# Patient Record
Sex: Female | Born: 1965 | Race: Black or African American | Hispanic: No | Marital: Single | Smoking: Never smoker
Health system: Southern US, Community
[De-identification: ages and names within clinical notes are randomized; demographics above are authoritative.]

## PROBLEM LIST (undated history)

## (undated) DIAGNOSIS — I10 Essential (primary) hypertension: Secondary | ICD-10-CM

## (undated) DIAGNOSIS — M329 Systemic lupus erythematosus, unspecified: Secondary | ICD-10-CM

## (undated) HISTORY — DX: Systemic lupus erythematosus, unspecified: M32.9

## (undated) HISTORY — DX: Essential (primary) hypertension: I10

## (undated) HISTORY — PX: ABDOMINAL HYSTERECTOMY: SHX81

---

## 2005-03-19 ENCOUNTER — Emergency Department: Payer: Self-pay | Admitting: Emergency Medicine

## 2005-03-26 ENCOUNTER — Ambulatory Visit: Payer: Self-pay | Admitting: Emergency Medicine

## 2006-03-04 ENCOUNTER — Emergency Department: Payer: Self-pay | Admitting: Emergency Medicine

## 2007-03-07 ENCOUNTER — Emergency Department: Payer: Self-pay | Admitting: Emergency Medicine

## 2007-03-09 ENCOUNTER — Inpatient Hospital Stay: Payer: Self-pay | Admitting: Internal Medicine

## 2007-04-13 ENCOUNTER — Emergency Department: Payer: Self-pay | Admitting: Emergency Medicine

## 2007-07-02 ENCOUNTER — Emergency Department: Payer: Self-pay | Admitting: Internal Medicine

## 2007-07-03 ENCOUNTER — Emergency Department: Payer: Self-pay | Admitting: Emergency Medicine

## 2007-12-18 ENCOUNTER — Other Ambulatory Visit: Payer: Self-pay

## 2007-12-18 ENCOUNTER — Emergency Department: Payer: Self-pay | Admitting: Emergency Medicine

## 2008-04-19 ENCOUNTER — Ambulatory Visit: Payer: Self-pay | Admitting: Family Medicine

## 2008-07-11 ENCOUNTER — Emergency Department: Payer: Self-pay | Admitting: Emergency Medicine

## 2008-10-01 ENCOUNTER — Ambulatory Visit: Payer: Self-pay | Admitting: Diagnostic Radiology

## 2008-10-07 ENCOUNTER — Ambulatory Visit: Payer: Self-pay | Admitting: Obstetrics and Gynecology

## 2009-04-12 ENCOUNTER — Emergency Department: Payer: Self-pay | Admitting: Emergency Medicine

## 2009-07-31 ENCOUNTER — Ambulatory Visit: Payer: Self-pay | Admitting: General Practice

## 2012-06-14 ENCOUNTER — Ambulatory Visit (INDEPENDENT_AMBULATORY_CARE_PROVIDER_SITE_OTHER): Payer: BC Managed Care – PPO | Admitting: Family Medicine

## 2012-06-14 VITALS — BP 182/112 | HR 96 | Temp 98.5°F | Resp 16 | Ht 65.0 in | Wt 198.0 lb

## 2012-06-14 DIAGNOSIS — J019 Acute sinusitis, unspecified: Secondary | ICD-10-CM

## 2012-06-14 DIAGNOSIS — D72819 Decreased white blood cell count, unspecified: Secondary | ICD-10-CM

## 2012-06-14 DIAGNOSIS — Z79899 Other long term (current) drug therapy: Secondary | ICD-10-CM

## 2012-06-14 DIAGNOSIS — IMO0001 Reserved for inherently not codable concepts without codable children: Secondary | ICD-10-CM

## 2012-06-14 DIAGNOSIS — M329 Systemic lupus erythematosus, unspecified: Secondary | ICD-10-CM

## 2012-06-14 DIAGNOSIS — D649 Anemia, unspecified: Secondary | ICD-10-CM

## 2012-06-14 DIAGNOSIS — R030 Elevated blood-pressure reading, without diagnosis of hypertension: Secondary | ICD-10-CM

## 2012-06-14 DIAGNOSIS — N951 Menopausal and female climacteric states: Secondary | ICD-10-CM

## 2012-06-14 LAB — POCT CBC
Hemoglobin: 14.2 g/dL (ref 12.2–16.2)
Lymph, poc: 1.3 (ref 0.6–3.4)
MCH, POC: 31.4 pg — AB (ref 27–31.2)
MCHC: 31.6 g/dL — AB (ref 31.8–35.4)
MCV: 99.3 fL — AB (ref 80–97)
MID (cbc): 0.3 (ref 0–0.9)
Platelet Count, POC: 277 10*3/uL (ref 142–424)
RBC: 4.52 M/uL (ref 4.04–5.48)
WBC: 4.1 10*3/uL — AB (ref 4.6–10.2)

## 2012-06-14 MED ORDER — AMOXICILLIN-POT CLAVULANATE 875-125 MG PO TABS: 1.0000 | ORAL_TABLET | Freq: Two times a day (BID) | ORAL | Status: AC

## 2012-06-14 MED ORDER — PREDNISONE 10 MG PO TABS: ORAL_TABLET | ORAL | Status: AC

## 2012-06-14 NOTE — Progress Notes (Signed)
Subjective:    Patient ID: Jill Morgan, female    DOB: 05-24-1966, 46 y.o.   MRN: [id number]  HPI  Pt thinks she may be starting to be menopausal. Having hot flashes, insomnia, night sweats for 2-3 wks. Had partial hysterectomy in 2010 for anemia - no cancer.  Is having mood swings, no breast tenderness.  Monday (3d prev) she dev sinus and chest congestion, sinus pain, teeth pain, jaw pain, dry cough, sneezing, voice change, lymph nodes swollen, no sore throat,  Has a h/o HTN - think she was put triamterene and was allergic to this.  Then she was put on something else to get her BP down but then taken off once it was down.  States that her blood pressure often gets to 185/115 when she is sick but that it is veyr labile and that when she if feeling well she can be hypotensive. Her lupus is very well controlled on plaquenil alone. She sees her rheumatologist once a yr but does not have any other or reg doctor.   Review of Systems  Constitutional: Positive for diaphoresis and fatigue. Negative for fever, chills, appetite change and unexpected weight change.  HENT: Positive for congestion, rhinorrhea, sneezing, trouble swallowing, voice change, postnasal drip and sinus pressure. Negative for sore throat, neck pain, neck stiffness, dental problem and ear discharge.   Respiratory: Negative for shortness of breath.   Gastrointestinal: Negative for nausea, diarrhea and constipation.  Genitourinary: Negative for menstrual problem.  Skin: Negative for color change, pallor and rash.  Neurological: Negative for weakness.  Hematological: Positive for adenopathy.  Psychiatric/Behavioral: Positive for behavioral problems and disturbed wake/sleep cycle. The patient is not nervous/anxious.        Objective:   Physical Exam  Constitutional: She is oriented to person, place, and time. She appears well-developed and well-nourished. No distress.  HENT:  Head: Normocephalic and atraumatic.  Right Ear:  External ear and ear canal normal. Tympanic membrane is retracted. A middle ear effusion is present.  Left Ear: External ear and ear canal normal. Tympanic membrane is retracted. A middle ear effusion is present.  Nose: Mucosal edema and rhinorrhea present. Right sinus exhibits maxillary sinus tenderness. Left sinus exhibits maxillary sinus tenderness.  Mouth/Throat: Oropharynx is clear and moist. No oropharyngeal exudate.  Eyes: Conjunctivae normal and EOM are normal. Pupils are equal, round, and reactive to light. Right eye exhibits no discharge. Left eye exhibits no discharge. No scleral icterus.  Neck: Normal range of motion. Neck supple. No thyromegaly present.  Cardiovascular: Normal rate, regular rhythm, normal heart sounds and intact distal pulses.   Pulmonary/Chest: Effort normal and breath sounds normal. No respiratory distress.  Musculoskeletal: She exhibits no edema.  Lymphadenopathy:       Head (right side): Submandibular adenopathy present.       Head (left side): Submandibular adenopathy present.    She has cervical adenopathy.       Right cervical: Superficial cervical adenopathy present.       Left cervical: Superficial cervical adenopathy present.       Right: No supraclavicular adenopathy present.       Left: No supraclavicular adenopathy present.  Neurological: She is alert and oriented to person, place, and time. She has normal reflexes. She exhibits normal muscle tone.  Skin: Skin is warm and dry. No rash noted. She is not diaphoretic. No erythema. No pallor.  Psychiatric: She has a normal mood and affect. Her behavior is normal.  Results for orders placed in visit on 06/14/12  POCT CBC      Component Value Range   WBC 4.1 (*) 4.6 - 10.2 K/uL   Lymph, poc 1.3  0.6 - 3.4   POC LYMPH PERCENT 32.6  10 - 50 %L   MID (cbc) 0.3  0 - 0.9   POC MID % 6.2  0 - 12 %M   POC Granulocyte 2.5  2 - 6.9   Granulocyte percent 61.2  37 - 80 %G   RBC 4.52  4.04 - 5.48  M/uL   Hemoglobin 14.2  12.2 - 16.2 g/dL   HCT, POC 66.4  40.3 - 47.9 %   MCV 99.3 (*) 80 - 97 fL   MCH, POC 31.4 (*) 27 - 31.2 pg   MCHC 31.6 (*) 31.8 - 35.4 g/dL   RDW, POC 47.4     Platelet Count, POC 277  142 - 424 K/uL   MPV 8.3  0 - 99.8 fL    Assessment & Plan:  1. Sinusitis - Trx w/ prednisone taper and augmentin 2. Elevated blood pressure - no otc cough/cold preps. Recheck in 10d.  Will start bp meds at that time if not at goal. Pt agrees w/ plan 3. Lupus - stable, cont plaquinel 4. Hot flashes, night sweats - check cbc, tsh.  Check fsh and lh to investigate menopausal status and discuss further at f/u in 10d. Add on hiv since wbc mildly low.

## 2012-06-15 ENCOUNTER — Telehealth: Payer: Self-pay | Admitting: Family Medicine

## 2012-06-15 ENCOUNTER — Encounter: Payer: Self-pay | Admitting: Family Medicine

## 2012-06-15 DIAGNOSIS — M329 Systemic lupus erythematosus, unspecified: Secondary | ICD-10-CM | POA: Insufficient documentation

## 2012-06-15 DIAGNOSIS — Z79899 Other long term (current) drug therapy: Secondary | ICD-10-CM | POA: Insufficient documentation

## 2012-06-15 DIAGNOSIS — IMO0001 Reserved for inherently not codable concepts without codable children: Secondary | ICD-10-CM | POA: Insufficient documentation

## 2012-06-15 NOTE — Telephone Encounter (Signed)
Called to add on to patient specimen

## 2012-06-15 NOTE — Telephone Encounter (Signed)
Message copied by Gerrianne Scale on Thu Jun 15, 2012 11:27 AM ------      Message from: Clelia Croft, EVA      Created: Thu Jun 15, 2012 10:52 AM       i also ordered a tsh and hiv. Can these please be added to the blood in lab? If not, dont worry about it as we can get them at her f/u visit.      Thanks,      shaw

## 2012-06-16 LAB — TSH: TSH: 0.844 u[IU]/mL (ref 0.350–4.500)

## 2012-06-16 LAB — HIV ANTIBODY (ROUTINE TESTING W REFLEX): HIV: NONREACTIVE

## 2012-07-03 ENCOUNTER — Ambulatory Visit (INDEPENDENT_AMBULATORY_CARE_PROVIDER_SITE_OTHER): Payer: BC Managed Care – PPO | Admitting: Family Medicine

## 2012-07-03 VITALS — BP 143/85 | HR 86 | Temp 98.4°F | Resp 18 | Ht 64.0 in | Wt 196.6 lb

## 2012-07-03 DIAGNOSIS — IMO0001 Reserved for inherently not codable concepts without codable children: Secondary | ICD-10-CM

## 2012-07-03 DIAGNOSIS — R030 Elevated blood-pressure reading, without diagnosis of hypertension: Secondary | ICD-10-CM

## 2012-07-03 DIAGNOSIS — N951 Menopausal and female climacteric states: Secondary | ICD-10-CM

## 2012-07-03 DIAGNOSIS — R232 Flushing: Secondary | ICD-10-CM

## 2012-07-03 NOTE — Progress Notes (Signed)
  Subjective:    Patient ID: Jill Morgan, female    DOB: 10/05/1965, 46 y.o.   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  She doesn't remember numbers. Was on hctz in past but able to come off. Hot flashes intermittently.  Last wk she would suddenly break out in them and are affecting her sleep - have been dealing with for about 6-8 wks no.  Is having more irritability and trouble sleeping. No breast tenderness.  No past medical history on file.   Review of Systems  Constitutional: Positive for fatigue. Negative for fever and chills.  HENT: Positive for congestion and sinus pressure.   Cardiovascular: Negative for chest pain.  Genitourinary: Negative for vaginal bleeding.  Musculoskeletal: Negative for gait problem.  Skin: Negative for color change.  Neurological: Positive for headaches.  Hematological: Negative for adenopathy.  Psychiatric/Behavioral: Positive for disturbed wake/sleep cycle and dysphoric mood.      BP 143/85  Pulse 86  Temp 98.4 F (36.9 C) (Oral)  Resp 18  Ht 5\' 4"  (1.626 m)  Wt 196 lb 9.6 oz (89.177 kg)  BMI 33.75 kg/m2  SpO2 100% Objective:   Physical Exam  Constitutional: She is oriented to person, place, and time. She appears well-developed and well-nourished. No distress.  HENT:  Head: Normocephalic and atraumatic.  Right Ear: External ear normal.  Left Ear: External ear normal.  Eyes: Conjunctivae normal are normal. No scleral icterus.  Neck: Normal range of motion. Neck supple. No thyromegaly present.  Cardiovascular: Normal rate, regular rhythm, normal heart sounds and intact distal pulses.   Pulmonary/Chest: Effort normal and breath sounds normal. No respiratory distress.  Musculoskeletal: She exhibits no edema.  Lymphadenopathy:    She has no cervical adenopathy.  Neurological: She is alert and oriented to person, place, and time.  Skin: Skin is warm and dry. She is not diaphoretic. No erythema.  Psychiatric: She has a  normal mood and affect. Her behavior is normal.          Assessment & Plan:  1. Elevated BP - Cont to have checked at work 1-2x/wk and record. Call in sev wks w/ BP readings.  If  >140/90, start hctz. 2. Hot flashes - hormone testing was unrevealing - pt could be perimenopausal which is certainly most likely but difficult to tell due to hysterectomy (stil has ovaries.)  Rec trying some supplements - soy, black cohosh, evening primrose - to see if there is any help. If not, RTC for further eval. Could consider repeat hormone testing in 4-6 mos.

## 2012-07-03 NOTE — Patient Instructions (Addendum)
Diaphoresis Sweating is controlled by our nervous system. Sweat glands are found in the skin throughout the body. They exist in higher numbers in the skin of the hands, feet, armpits, and the genital region. Sweating occurs normally when the temperature of your body goes up. Diaphoresis means profuse sweating or perspiration due to an underlying medical condition or an external factor (such as medicines). Hyperhidrosis means excessive sweating that is not usually due to an underlying medical condition, on areas such as the palms, soles, or armpits. Other areas of the body may also be affected. Hyperhidrosis usually begins in childhood or early adolescence. It increases in severity through puberty and into adulthood. Sweaty palms are the most common problem and the most bothersome to people who have hyperhidrosis. CAUSES  Sweating is normally seen with exercise or being in hot surroundings. Not sweating in these conditions would be harmful. Stressful situations can also cause sweating. In some people, stimulation of the sweat glands during stress is overactive. Talking to strangers or shaking someone's hand can produce profuse sweating. Causes of sweating include:  Emotional upset.  Low blood pressure.  Low blood sugar.  Heart problems.  Low blood cell counts.  Certain pain relieving medicines.  Exercise.  Alcohol.  Infection.  Caffeine.  Spicy foods.  Hot flashes.  Overactive thyroid.  Illegal drug use, such as cocaine and amphetamine.  Use of medicines that stimulate parts of the nervous system.  A tumor (pheochromocytoma).  Withdrawal from some medicines or alcohol. DIAGNOSIS  Your caregiver needs to be consulted to make sure excessive sweating is not caused by another condition. Further testing may need to be done. TREATMENT   When hyperhidrosis is caused by another condition, that condition should be treated.  If menopause is the cause, you may wish to talk to your  caregiver about estrogen replacement.  If the hyperhidrosis is a natural happening of the way your body works, certain antiperspirants may help.  If medicines do not work, injections of botulinum toxin type A are sometimes used for underarm sweating.  Your caregiver can usually help you with this problem. Document Released: 03/22/2005 Document Revised: 11/22/2011 Document Reviewed: 10/07/2008 [facility] Patient Information 2013 [place], [place]. Menopause and Herbal Products Menopause is the normal time of life when menstrual periods stop completely. Menopause is complete when you have missed 12 consecutive menstrual periods. It usually occurs between the ages of 45 to 22, with an average age of 24. Very rarely does a woman develop menopause before 44 years old. At menopause, your ovaries stop producing the female hormones, estrogen and progesterone. This can cause undesirable symptoms and also affect your health. Sometimes the symptoms can occur 4 to 5 years before the menopause begins. There is no relationship between menopause and: Oral contraceptives. Number of children you had. Race. The age your menstrual periods started (menarche). Heavy smokers and very thin women may develop menopause earlier in life. Estrogen and progesterone hormone treatment is the usual method of treating menopausal symptoms. However, there are women who should not take hormone treatment. This is true of:  Women that have breast or uterine cancer. Women who prefer not to take hormones because of certain side effects (abnormal uterine bleeding). Women who are afraid that hormones may cause breast cancer. Women who have a history of liver disease, heart disease, stroke, or blood clots. For these women, there are other medications that may help treat their menopausal symptoms. These medications are found in plants and botanical products. They can be found  in the form of herbs, teas, oils, tinctures, and pills.   CAUSES: The ovaries stop producing the female hormones estrogen and progesterone. Other causes include: Surgery to remove both ovaries. The ovaries stop functioning for no know reason. Tumors of the pituitary gland in the brain. Medical disease that affects the ovaries and hormone production. Radiation treatment to the abdomen or pelvis. Chemotherapy that affects the ovaries. PHYTOESTROGENS: Phytoestrogen's occur naturally in plants and plant products. They act like estrogen in the body. Herbal medications are made from these plants and botanical steroids. There are 3 types of phytoestrogens: Isoflavones (genistein and daidzein) are found in soy, garbanzo beans, miso and tofu foods. Ligins are found in the shell of seeds. They are used to make oils like flaxseed oil. The bacteria in your intestine act on these foods to produce the estrogen-like hormones. Coumestans are estrogen-like. Some of the foods they are found in include sunflower seeds and bean sprouts. CONDITIONS AND THERE POSSIBLE HERBAL TREATMENT: Hot flashes and night sweats. Soy, black cohosh and evening primrose. Irritability, insomnia, depression and memory problems. Chasteberry, ginseng, and soy. St. John's wort may be helpful for depression. However, there is a concern of it causing cataracts of the eye and may have bad effects on other medications. St. John's wort should not be taken for long time and without your caregiver's advice. Loss of libido and vaginal and skin dryness. Wild yam and soy. Prevention of coronary heart disease and osteoporosis. Soy and Isoflavones. Several studies have shown that some women benefit from herbal medications, but most of the studies have not consistently shown that these supplements are much better than placebo. Other forms of treatment to help women with menopausal symptoms include a balanced diet, rest, exercise, vitamin and calcium (with vitamin D) supplements, acupuncture, and group  therapy when necessary. THOSE WHO SHOULD NOT TAKE HERBAL MEDICATIONS INCLUDE: Women who are planning on getting pregnant unless told by your caregiver. Women who are breastfeeding unless told by your caregiver. Women who are taking other prescription medications unless told by your caregiver. Infants, children, and elderly women unless told by your caregiver. Different herbal medications have different and unmeasured amounts of the herbal ingredients. There are no regulations, quality control, and standardization of the ingredients in herbal medications. Therefore, the amount of the ingredient in the medication may vary from one herb, pill, tea, oil or tincture to another. Many herbal medications can cause serious problems and can even have poisonous effects if taken too much or too long. If problems develop, the medication should be stopped and recorded by your caregiver. HOME CARE INSTRUCTIONS Do not take or give children herbal medications without your caregiver's advice. Let your caregiver know all the medications you are taking. This includes prescription, over-the-counter, eye drops, and creams. Do not take herbal medications longer or more than recommended. Tell your caregiver about any side effects from the medication. SEEK MEDICAL CARE IF: You develop a fever of 102 F (38.9 C), or as directed by your caregiver. You feel sick to your stomach (nauseous), vomit, or have diarrhea. You develop a rash. You develop abdominal pain. You develop severe headaches. You start to have vision problems. You feel dizzy or faint. You start to feel numbness in any part of your body. You start shaking (have convulsions). Document Released: 02/16/2008 Document Revised: 11/22/2011 Document Reviewed: 09/15/2010 [facility] Patient Information 2013 [employer], [place]. Perimenopause Perimenopause is the time when your body begins to move into the menopause (no menstrual period for 12 straight  months). It is a  natural process. Perimenopause can begin 2 to 8 years before the menopause and usually lasts for one year after the menopause. During this time, your ovaries may or may not produce an egg. The ovaries vary in their production of estrogen and progesterone hormones each month. This can cause irregular menstrual periods, difficulty in getting pregnant, vaginal bleeding between periods and uncomfortable symptoms. CAUSES  Irregular production of the ovarian hormones, estrogen and progesterone, and not ovulating every month.  Other causes include:  Tumor of the pituitary gland in the brain.  Medical disease that affects the ovaries.  Radiation treatment.  Chemotherapy.  Unknown causes.  Heavy smoking and excessive alcohol intake can bring on perimenopause sooner. SYMPTOMS   Hot flashes.  Night sweats.  Irregular menstrual periods.  Decrease sex drive.  Vaginal dryness.  Headaches.  Mood swings.  Depression.  Memory problems.  Irritability.  Tiredness.  Weight gain.  Trouble getting pregnant.  The beginning of losing bone cells (osteoporosis).  The beginning of hardening of the arteries (atherosclerosis). DIAGNOSIS  Your caregiver will make a diagnosis by analyzing your age, menstrual history and your symptoms. They will do a physical exam noting any changes in your body, especially your female organs. Female hormone tests may or may not be helpful depending on the amount and when you produce the female hormones. However, other hormone tests may be helpful (ex. thyroid hormone) to rule out other problems. TREATMENT  The decision to treat during the perimenopause should be made by you and your caregiver depending on how the symptoms are affecting you and your life style. There are various treatments available such as:  Treating individual symptoms with a specific medication for that symptom (ex. tranquilizer for depression).  Herbal medications that can help specific  symptoms.  Counseling.  Group therapy.  No treatment. HOME CARE INSTRUCTIONS   Before seeing your caregiver, make a list of your menstrual periods (when the occur, how heavy they are, how long between periods and how long they last), your symptoms and when they started.  Take the medication as recommended by your caregiver.  Sleep and rest.  Exercise.  Eat a diet that contains calcium (good for your bones) and soy (acts like estrogen hormone).  Do not smoke.  Avoid alcoholic beverages.  Taking vitamin E may help in certain cases.  Take calcium and vitamin D supplements to help prevent bone loss.  Group therapy is sometimes helpful.  Acupuncture may help in some cases. SEEK MEDICAL CARE IF:   You have any of the above and want to know if it is perimenopause.  You want advice and treatment for any of your symptoms mentioned above.  You need a referral to a specialist (gynecologist, psychiatrist or psychologist). SEEK IMMEDIATE MEDICAL CARE IF:   You have vaginal bleeding.  Your period lasts longer than 8 days.  You periods are recurring sooner than 21 days.  You have bleeding after intercourse.  You have severe depression.  You have pain when you urinate.  You have severe headaches.  You develop vision problems. Document Released: 10/07/2004 Document Revised: 11/22/2011 Document Reviewed: 06/27/2008 [facility] Patient Information 2013 [place], [place].

## 2012-08-02 ENCOUNTER — Telehealth: Payer: Self-pay

## 2012-08-02 NOTE — Telephone Encounter (Signed)
Pt is a lupus patient and has been around someone that has been diagnosed with the flu and is now experiencing symptoms and would like to know if we can call something in for her  Best number [phone]

## 2012-08-03 NOTE — Telephone Encounter (Signed)
Tried to call pt on both #s. No ans/no VM

## 2012-08-03 NOTE — Telephone Encounter (Signed)
Pt returned call.  Best number [phone].

## 2012-08-03 NOTE — Telephone Encounter (Signed)
Tried to call again. No ans/no VM. 

## 2012-08-04 NOTE — Telephone Encounter (Signed)
Per PA she needs to come in to be seen

## 2012-08-05 NOTE — Telephone Encounter (Signed)
Left message for her, to advise to come back in.

## 2012-08-19 ENCOUNTER — Ambulatory Visit (INDEPENDENT_AMBULATORY_CARE_PROVIDER_SITE_OTHER): Payer: BC Managed Care – PPO | Admitting: Family Medicine

## 2012-08-19 VITALS — BP 152/103 | HR 80 | Temp 98.3°F | Resp 16 | Ht 64.5 in | Wt 198.0 lb

## 2012-08-19 DIAGNOSIS — R232 Flushing: Secondary | ICD-10-CM

## 2012-08-19 DIAGNOSIS — N951 Menopausal and female climacteric states: Secondary | ICD-10-CM

## 2012-08-19 DIAGNOSIS — I10 Essential (primary) hypertension: Secondary | ICD-10-CM

## 2012-08-19 DIAGNOSIS — M791 Myalgia, unspecified site: Secondary | ICD-10-CM

## 2012-08-19 DIAGNOSIS — J029 Acute pharyngitis, unspecified: Secondary | ICD-10-CM

## 2012-08-19 DIAGNOSIS — M329 Systemic lupus erythematosus, unspecified: Secondary | ICD-10-CM

## 2012-08-19 DIAGNOSIS — IMO0001 Reserved for inherently not codable concepts without codable children: Secondary | ICD-10-CM

## 2012-08-19 LAB — POCT CBC
Granulocyte percent: 61.5 %G (ref 37–80)
Hemoglobin: 14.3 g/dL (ref 12.2–16.2)
MCV: 99.6 fL — AB (ref 80–97)
MID (cbc): 0.3 (ref 0–0.9)
Platelet Count, POC: 259 10*3/uL (ref 142–424)
RBC: 4.64 M/uL (ref 4.04–5.48)

## 2012-08-19 LAB — CK: Total CK: 94 U/L (ref 7–177)

## 2012-08-19 LAB — POCT RAPID STREP A (OFFICE): Rapid Strep A Screen: NEGATIVE

## 2012-08-19 MED ORDER — IBUPROFEN 800 MG PO TABS: 800.0000 mg | ORAL_TABLET | Freq: Three times a day (TID) | ORAL | Status: AC | PRN

## 2012-08-19 NOTE — Progress Notes (Signed)
Subjective: Patient is here today with a history of not feeling well for the past couple of days. She's been having hot flashes and flushed sensation. She has body aches and feels like she has the flu. She has not had any actual fever however. She has had these kind of hot flash sensations off and on at times. A Cook including a couple months ago when she was in the office. She has the diagnoses of having lupus. She had abnormal symptoms from about 2000 02/13/2008 weight was 5 diagnosed at [facility]. She has a rheumatologist there, but has not seen them for about a year. She has been coming sporadically here to [employer]. She is on her same regular medications. Last time Dr. Clelia Croft felt like the patient had a borderline FSH indicating may be early menopause. She's had a discharge weight. She was told to take OTC black cohosh containing medication. She generally works 2 jobs, as a Geophysicist/field seismologist and as a Lawyer. She has remained very functional despite her chronic disease issues. Her throat is been little bit sore. She had to dig some white stuff out of the tonsil. She aches in her hands.  Objective: Pleasant lady in no major distress at this time. TMs normal. Throat not erythematous. Strep screen was taken. Neck supple without significant nodes. Chest clear to auscultation. Heart regular without murmurs. Abdomen soft without mass or tenderness. Extremities grossly normal. Skin is cool and dry, though she says she just started having the lows hot flash sensations now.  Assessment: Hot flashes History of lupus Pharyngitis Myalgias Early menopause  Plan: Check CPK, sedimentation rate, strep screen, and repeat her [facility] May need to put her on some estrogen. We'll probably recommend she tries to see her rheumatologist back sometime in the near future  Results for orders placed in visit on 08/19/12  POCT CBC      Component Value Range   WBC 3.9 (*) 4.6 - 10.2 K/uL   Lymph, poc 1.2  0.6 - 3.4    POC LYMPH PERCENT 31.6  10 - 50 %L   MID (cbc) 0.3  0 - 0.9   POC MID % 6.9  0 - 12 %M   POC Granulocyte 2.4  2 - 6.9   Granulocyte percent 61.5  37 - 80 %G   RBC 4.64  4.04 - 5.48 M/uL   Hemoglobin 14.3  12.2 - 16.2 g/dL   HCT, POC 40.9  81.1 - 47.9 %   MCV 99.6 (*) 80 - 97 fL   MCH, POC 30.8  27 - 31.2 pg   MCHC 31.0 (*) 31.8 - 35.4 g/dL   RDW, POC 91.4     Platelet Count, POC 259  142 - 424 K/uL   MPV 8.3  0 - 99.8 fL  POCT RAPID STREP A (OFFICE)      Component Value Range   Rapid Strep A Screen Negative  Negative   Hx of a myositis in past when her CPK went very high [id number].  Decided against steroids today.  Take NSAIDS.  If FSH high will treat with estrogens.  Needs to see a rheumatologist.  Patient's blood pressure was high and we called her back to tell her we're going to put her on medication. Started lisinopril HCT 10/12.5 one daily. She is to monitor it and let us know.

## 2012-08-19 NOTE — Addendum Note (Signed)
Addended byCaffie Damme on: 08/19/2012 05:22 PM   Modules accepted: Orders

## 2012-08-19 NOTE — Patient Instructions (Signed)
Take antiinflammatory meds (ibuprofen, aleve) for a few days  When better increase exercise and work on weight loss as discussed.  Await test regarding menopause.

## 2012-08-20 MED ORDER — ESTRADIOL 0.5 MG PO TABS: 0.5000 mg | ORAL_TABLET | Freq: Every day | ORAL | Status: AC

## 2012-08-20 NOTE — Addendum Note (Signed)
Addended by: Johnnette Litter on: 08/20/2012 03:07 PM   Modules accepted: Orders

## 2012-11-27 ENCOUNTER — Other Ambulatory Visit: Payer: Self-pay | Admitting: Family Medicine

## 2012-11-27 NOTE — Telephone Encounter (Signed)
Needs office visit.

## 2012-12-06 ENCOUNTER — Other Ambulatory Visit: Payer: Self-pay | Admitting: Internal Medicine

## 2012-12-06 DIAGNOSIS — Z1231 Encounter for screening mammogram for malignant neoplasm of breast: Secondary | ICD-10-CM

## 2012-12-15 ENCOUNTER — Ambulatory Visit
Admission: RE | Admit: 2012-12-15 | Discharge: 2012-12-15 | Disposition: A | Discharge status: Home / Self Care | Payer: BC Managed Care – PPO | Source: Ambulatory Visit | Attending: Internal Medicine | Admitting: Internal Medicine

## 2012-12-15 DIAGNOSIS — Z1231 Encounter for screening mammogram for malignant neoplasm of breast: Secondary | ICD-10-CM

## 2013-02-13 ENCOUNTER — Emergency Department: Payer: Self-pay | Admitting: Emergency Medicine

## 2013-03-12 ENCOUNTER — Other Ambulatory Visit: Payer: Self-pay | Admitting: Family Medicine

## 2013-06-21 ENCOUNTER — Other Ambulatory Visit: Payer: Self-pay | Admitting: Physician Assistant

## 2013-07-27 ENCOUNTER — Other Ambulatory Visit: Payer: Self-pay | Admitting: Physician Assistant

## 2013-11-02 ENCOUNTER — Ambulatory Visit: Payer: Self-pay | Admitting: Internal Medicine

## 2013-11-11 ENCOUNTER — Ambulatory Visit: Payer: Self-pay | Admitting: Internal Medicine

## 2013-11-17 ENCOUNTER — Other Ambulatory Visit: Payer: Self-pay | Admitting: Physician Assistant

## 2014-02-11 ENCOUNTER — Ambulatory Visit: Payer: Self-pay | Admitting: Urology

## 2014-02-18 ENCOUNTER — Other Ambulatory Visit: Payer: Self-pay

## 2014-02-18 DIAGNOSIS — Z1231 Encounter for screening mammogram for malignant neoplasm of breast: Secondary | ICD-10-CM

## 2014-02-21 ENCOUNTER — Ambulatory Visit
Admission: RE | Admit: 2014-02-21 | Discharge: 2014-02-21 | Disposition: A | Discharge status: Home / Self Care | Payer: BC Managed Care – PPO | Source: Ambulatory Visit

## 2014-02-21 ENCOUNTER — Encounter (INDEPENDENT_AMBULATORY_CARE_PROVIDER_SITE_OTHER): Payer: Self-pay

## 2014-02-21 DIAGNOSIS — Z1231 Encounter for screening mammogram for malignant neoplasm of breast: Secondary | ICD-10-CM

## 2016-06-08 ENCOUNTER — Other Ambulatory Visit: Payer: Self-pay | Admitting: Internal Medicine

## 2016-06-08 DIAGNOSIS — Z1231 Encounter for screening mammogram for malignant neoplasm of breast: Secondary | ICD-10-CM

## 2016-06-29 ENCOUNTER — Ambulatory Visit
Admission: RE | Admit: 2016-06-29 | Discharge: 2016-06-29 | Disposition: A | Discharge status: Home / Self Care | Payer: BC Managed Care – PPO | Source: Ambulatory Visit | Attending: Internal Medicine | Admitting: Internal Medicine

## 2016-06-29 ENCOUNTER — Ambulatory Visit: Payer: BC Managed Care – PPO

## 2016-06-29 DIAGNOSIS — Z1231 Encounter for screening mammogram for malignant neoplasm of breast: Secondary | ICD-10-CM

## 2017-10-03 ENCOUNTER — Other Ambulatory Visit: Payer: Self-pay | Admitting: Internal Medicine

## 2017-10-03 ENCOUNTER — Ambulatory Visit
Admission: RE | Admit: 2017-10-03 | Discharge: 2017-10-03 | Disposition: A | Discharge status: Home / Self Care | Payer: BC Managed Care – PPO | Source: Ambulatory Visit | Attending: Internal Medicine | Admitting: Internal Medicine

## 2017-10-03 DIAGNOSIS — M7989 Other specified soft tissue disorders: Secondary | ICD-10-CM

## 2017-10-06 ENCOUNTER — Other Ambulatory Visit: Payer: Self-pay | Admitting: Internal Medicine

## 2017-10-06 DIAGNOSIS — Z1231 Encounter for screening mammogram for malignant neoplasm of breast: Secondary | ICD-10-CM

## 2017-10-27 ENCOUNTER — Ambulatory Visit: Payer: BC Managed Care – PPO

## 2017-11-17 ENCOUNTER — Ambulatory Visit
Admission: RE | Admit: 2017-11-17 | Discharge: 2017-11-17 | Disposition: A | Discharge status: Home / Self Care | Payer: BC Managed Care – PPO | Source: Ambulatory Visit | Attending: Internal Medicine | Admitting: Internal Medicine

## 2017-11-17 DIAGNOSIS — Z1231 Encounter for screening mammogram for malignant neoplasm of breast: Secondary | ICD-10-CM

## 2018-10-26 ENCOUNTER — Other Ambulatory Visit: Payer: Self-pay | Admitting: Internal Medicine

## 2018-10-26 DIAGNOSIS — Z1231 Encounter for screening mammogram for malignant neoplasm of breast: Secondary | ICD-10-CM

## 2018-11-23 ENCOUNTER — Other Ambulatory Visit: Payer: Self-pay

## 2018-11-23 ENCOUNTER — Other Ambulatory Visit: Payer: Self-pay | Admitting: Internal Medicine

## 2018-11-23 ENCOUNTER — Ambulatory Visit
Admission: RE | Admit: 2018-11-23 | Discharge: 2018-11-23 | Disposition: A | Discharge status: Home / Self Care | Payer: BC Managed Care – PPO | Source: Ambulatory Visit | Attending: Internal Medicine | Admitting: Internal Medicine

## 2018-11-23 DIAGNOSIS — Z1231 Encounter for screening mammogram for malignant neoplasm of breast: Secondary | ICD-10-CM

## 2019-05-16 ENCOUNTER — Other Ambulatory Visit: Payer: Self-pay

## 2019-05-16 ENCOUNTER — Emergency Department
Admission: EM | Admit: 2019-05-16 | Discharge: 2019-05-16 | Disposition: A | Discharge status: Home / Self Care | Payer: BC Managed Care – PPO | Attending: Emergency Medicine | Admitting: Emergency Medicine

## 2019-05-16 ENCOUNTER — Emergency Department: Payer: BC Managed Care – PPO

## 2019-05-16 DIAGNOSIS — Z7902 Long term (current) use of antithrombotics/antiplatelets: Secondary | ICD-10-CM | POA: Insufficient documentation

## 2019-05-16 DIAGNOSIS — Z86718 Personal history of other venous thrombosis and embolism: Secondary | ICD-10-CM | POA: Diagnosis not present

## 2019-05-16 DIAGNOSIS — I10 Essential (primary) hypertension: Secondary | ICD-10-CM | POA: Diagnosis not present

## 2019-05-16 DIAGNOSIS — Z79899 Other long term (current) drug therapy: Secondary | ICD-10-CM | POA: Diagnosis not present

## 2019-05-16 DIAGNOSIS — M3210 Systemic lupus erythematosus, organ or system involvement unspecified: Secondary | ICD-10-CM | POA: Insufficient documentation

## 2019-05-16 DIAGNOSIS — M25562 Pain in left knee: Secondary | ICD-10-CM | POA: Insufficient documentation

## 2019-05-16 NOTE — ED Triage Notes (Signed)
Pt to the er for pain to the left knee. Pt has a torn meniscus that was diagnosed on Monday. Pt now has pain behind the left knee and distal medial side. Pain shoots down the front of the lower leg. Pt has a hx of lupus and DVT. Some swelling and bruising to the medial anterior

## 2019-05-16 NOTE — ED Provider Notes (Signed)
[facility] Emergency Department Provider Note  ____________________________________________  Time seen: Approximately 10:49 PM  I have reviewed the triage vital signs and the nursing notes.   HISTORY  Chief Complaint Knee Pain    HPI Jill Morgan is a 53 y.o. female with a history of DVT, presents to the emergency department with left lower extremity swelling and posterior knee pain that started today.  Patient reports a history of prior DVT and she is not currently anticoagulated.  She has worn a knee immobilizer on the left knee for the past 3 days.  She denies recent falls or traumas.  She has been tentatively diagnosed with a meniscal tear.  She denies recent surgery or recent travel.  She denies daily smoking or use of oral contraceptives.  Patient cannot remember when she was last diagnosed with a DVT.        Past Medical History:  Diagnosis Date  . Hypertension   . Lupus (HCC)   . Lupus [facility](HCC)     Patient Active Problem List   Diagnosis Date Noted  . HTN (hypertension) 08/19/2012  . Elevated blood pressure 06/15/2012  . Lupus (systemic lupus erythematosus) (HCC) 06/15/2012  . Encounter for long-term (current) use of other medications 06/15/2012    Past Surgical History:  Procedure Laterality Date  . ABDOMINAL HYSTERECTOMY      Prior to Admission medications   Medication Sig Start Date End Date Taking? Authorizing Provider  amoxicillin-clavulanate (AUGMENTIN) 875-125 MG per tablet Take 1 tablet by mouth 2 (two) times daily. 06/14/12   Sherren MochaShaw, Eva N, MD  B Complex Vitamins (B-COMPLEX/B-12 PO) Take 1 capsule by mouth daily.    [provider]  estradiol (ESTRACE) 0.5 MG tablet Take 1 tablet (0.5 mg total) by mouth daily. 08/20/12   Peyton NajjarHopper, David H, MD  hydroxychloroquine (PLAQUENIL) 200 MG tablet Take 200 mg by mouth 2 (two) times daily.     [provider]  ibuprofen (ADVIL,MOTRIN) 200 MG tablet Take 200 mg by mouth every 6  (six) hours as needed.    [provider]  ibuprofen (ADVIL,MOTRIN) 800 MG tablet Take 1 tablet (800 mg total) by mouth every 8 (eight) hours as needed for pain. 08/19/12   Peyton NajjarHopper, David H, MD  lisinopril-hydrochlorothiazide (PRINZIDE,ZESTORETIC) 10-12.5 MG per tablet Take 1 tablet by mouth daily. PATIENT NEEDS OFFICE VISIT FOR ADDITIONAL REFILLS - 2nd NOTICE 07/27/13   Godfrey PickEgan, Eleanore E, PA-C  predniSONE (DELTASONE) 10 MG tablet Taper as directed from 6 tabs a day reduce by 1 each day 06/14/12   Sherren MochaShaw, Eva N, MD    Allergies Patient has no known allergies.  No family history on file.  Social History Social History   Tobacco Use  . Smoking status: Never Smoker  Substance Use Topics  . Alcohol use: Never    Frequency: Never  . Drug use: Never     Review of Systems  Constitutional: No fever/chills Eyes: No visual changes. No discharge ENT: No upper respiratory complaints. Cardiovascular: no chest pain. Respiratory: no cough. No SOB. Gastrointestinal: No abdominal pain.  No nausea, no vomiting.  No diarrhea.  No constipation. Genitourinary: Negative for dysuria. No hematuria Musculoskeletal: Patient has left knee pain.  Skin: Negative for rash, abrasions, lacerations, ecchymosis. Neurological: Negative for headaches, focal weakness or numbness.   ____________________________________________   PHYSICAL EXAM:  VITAL SIGNS: ED Triage Vitals [05/16/19 2124]  Enc Vitals Group     BP 139/87     Pulse Rate 91  Resp 18     Temp 98.6 F (37 C)     Temp Source Oral     SpO2 100 %     Weight 201 lb (91.2 kg)     Height 5\' 5"  (1.651 m)     Head Circumference      Peak Flow      Pain Score 8     Pain Loc      Pain Edu?      Excl. in GC?      Constitutional: Alert and oriented. Well appearing and in no acute distress. Eyes: Conjunctivae are normal. PERRL. EOMI. Head: Atraumatic. Cardiovascular: Normal rate, regular rhythm. Normal S1 and S2.  Good peripheral  circulation. Respiratory: Normal respiratory effort without tachypnea or retractions. Lungs CTAB. Good air entry to the bases with no decreased or absent breath sounds. Musculoskeletal: Patient has loss of peripatellar dimpling of left knee.  She has mild swelling at left ankle.  She has tenderness with palpation of left calf.  Palpable dorsalis pedis pulse, left. Neurologic:  Normal speech and language. No gross focal neurologic deficits are appreciated.  Skin:  Skin is warm, dry and intact. No rash noted. Psychiatric: Mood and affect are normal. Speech and behavior are normal. Patient exhibits appropriate insight and judgement.   ____________________________________________   LABS (all labs ordered are listed, but only abnormal results are displayed)  Labs Reviewed - No data to display ____________________________________________  EKG   ____________________________________________  RADIOLOGY I personally viewed and evaluated these images as part of my medical decision making, as well as reviewing the written report by the radiologist.    US Venous Img Lower Unilateral Left  Result Date: 05/16/2019 CLINICAL DATA:  Initial evaluation for possible DVT. EXAM: LEFT LOWER EXTREMITY VENOUS DOPPLER ULTRASOUND TECHNIQUE: Gray-scale sonography with graded compression, as well as color Doppler and duplex ultrasound were performed to evaluate the lower extremity deep venous systems from the level of the common femoral vein and including the common femoral, femoral, profunda femoral, popliteal and calf veins including the posterior tibial, peroneal and gastrocnemius veins when visible. The superficial great saphenous vein was also interrogated. Spectral Doppler was utilized to evaluate flow at rest and with distal augmentation maneuvers in the common femoral, femoral and popliteal veins. COMPARISON:  None. FINDINGS: Contralateral Common Femoral Vein: Respiratory phasicity is normal and symmetric with  the symptomatic side. No evidence of thrombus. Normal compressibility. Common Femoral Vein: No evidence of thrombus. Normal compressibility, respiratory phasicity and response to augmentation. Saphenofemoral Junction: No evidence of thrombus. Normal compressibility and flow on color Doppler imaging. Profunda Femoral Vein: No evidence of thrombus. Normal compressibility and flow on color Doppler imaging. Femoral Vein: No evidence of thrombus. Normal compressibility, respiratory phasicity and response to augmentation. Popliteal Vein: No evidence of thrombus. Normal compressibility, respiratory phasicity and response to augmentation. Calf Veins: No evidence of thrombus. Normal compressibility and flow on color Doppler imaging. Superficial Great Saphenous Vein: No evidence of thrombus. Normal compressibility. Venous Reflux:  None. Other Findings:  None. IMPRESSION: No evidence of deep venous thrombosis. Electronically Signed   By: Rise Mu M.D.   On: 05/16/2019 23:37    ____________________________________________    PROCEDURES  Procedure(s) performed:    Procedures    Medications - No data to display   ____________________________________________   INITIAL IMPRESSION / ASSESSMENT AND PLAN / ED COURSE  Pertinent labs & imaging results that were available during my care of the patient were reviewed by me and considered in  my medical decision making (see chart for details).  Review of the [place] CSRS was performed in accordance of the [place] prior to dispensing any controlled drugs.           Assessment and Plan: Left knee pain:  53 year old female presents to the emergency department with perceived left lower extremity swelling and worsening posterior knee pain.  Patient has been in a knee immobilizer for the past 3 days and has history of prior DVT.  Vital signs were reassuring at triage.  Patient had popliteal fullness of left knee and mild edema at left ankle.  No overlying  erythema of the left lower extremity.  Differential diagnosis includes DVT, left knee pain, venous insufficiency...  Venous ultrasound was noncontributory for thromboembolism.  Patient was advised that mild left ankle edema was likely secondary to immobilization.  Patient was advised to follow-up with orthopedics as needed regarding left knee pain.  Patient has been prescribed pain medication already.  All patient questions were answered.   ____________________________________________  FINAL CLINICAL IMPRESSION(S) / ED DIAGNOSES  Final diagnoses:  Acute pain of left knee      NEW MEDICATIONS STARTED DURING THIS VISIT:  ED Discharge Orders    None          This chart was dictated using voice recognition software/Dragon. Despite best efforts to proofread, errors can occur which can change the meaning. Any change was purely unintentional.    Lannie Fields, PA-C 05/17/19 1638    Nance Pear, MD 05/19/19 [phone]

## 2019-11-26 ENCOUNTER — Ambulatory Visit: Payer: BC Managed Care – PPO

## 2020-02-29 ENCOUNTER — Other Ambulatory Visit: Payer: Self-pay | Admitting: Obstetrics and Gynecology

## 2020-02-29 DIAGNOSIS — E2839 Other primary ovarian failure: Secondary | ICD-10-CM

## 2020-02-29 DIAGNOSIS — Z1231 Encounter for screening mammogram for malignant neoplasm of breast: Secondary | ICD-10-CM

## 2020-04-10 ENCOUNTER — Other Ambulatory Visit: Payer: BC Managed Care – PPO

## 2020-04-10 ENCOUNTER — Ambulatory Visit
Admission: RE | Admit: 2020-04-10 | Discharge: 2020-04-10 | Disposition: A | Discharge status: Home / Self Care | Payer: BC Managed Care – PPO | Source: Ambulatory Visit | Attending: Obstetrics and Gynecology | Admitting: Obstetrics and Gynecology

## 2020-04-10 ENCOUNTER — Other Ambulatory Visit: Payer: Self-pay

## 2020-04-10 DIAGNOSIS — Z1231 Encounter for screening mammogram for malignant neoplasm of breast: Secondary | ICD-10-CM

## 2021-07-01 ENCOUNTER — Other Ambulatory Visit: Payer: Self-pay | Admitting: Internal Medicine

## 2021-07-01 ENCOUNTER — Other Ambulatory Visit: Payer: Self-pay | Admitting: Obstetrics and Gynecology

## 2021-07-01 DIAGNOSIS — Z1231 Encounter for screening mammogram for malignant neoplasm of breast: Secondary | ICD-10-CM

## 2021-08-04 ENCOUNTER — Other Ambulatory Visit: Payer: Self-pay

## 2021-08-04 ENCOUNTER — Ambulatory Visit
Admission: RE | Admit: 2021-08-04 | Discharge: 2021-08-04 | Disposition: A | Discharge status: Home / Self Care | Payer: BC Managed Care – PPO | Source: Ambulatory Visit | Attending: Internal Medicine | Admitting: Internal Medicine

## 2021-08-04 DIAGNOSIS — Z1231 Encounter for screening mammogram for malignant neoplasm of breast: Secondary | ICD-10-CM

## 2021-11-12 ENCOUNTER — Other Ambulatory Visit: Payer: Self-pay | Admitting: Internal Medicine

## 2021-11-12 DIAGNOSIS — R1030 Lower abdominal pain, unspecified: Secondary | ICD-10-CM

## 2021-11-16 ENCOUNTER — Ambulatory Visit
Admission: RE | Admit: 2021-11-16 | Discharge: 2021-11-16 | Disposition: A | Discharge status: Home / Self Care | Payer: BC Managed Care – PPO | Source: Ambulatory Visit | Attending: Internal Medicine | Admitting: Internal Medicine

## 2021-11-16 ENCOUNTER — Other Ambulatory Visit: Payer: Self-pay

## 2021-11-16 DIAGNOSIS — R1030 Lower abdominal pain, unspecified: Secondary | ICD-10-CM

## 2021-11-16 MED ORDER — IOPAMIDOL (ISOVUE-300) INJECTION 61%
100.0000 mL | Freq: Once | INTRAVENOUS | Status: AC | PRN
  Administered 2021-11-16: 100 mL via INTRAVENOUS

## 2022-09-02 ENCOUNTER — Other Ambulatory Visit: Payer: Self-pay

## 2022-09-02 MED ORDER — WEGOVY 1 MG/0.5ML ~~LOC~~ SOAJ: SUBCUTANEOUS | 0 refills | Status: AC

## 2022-09-02 MED ORDER — WEGOVY 0.5 MG/0.5ML ~~LOC~~ SOAJ: SUBCUTANEOUS | 0 refills | Status: AC

## 2022-09-02 MED ORDER — WEGOVY 0.25 MG/0.5ML ~~LOC~~ SOAJ
SUBCUTANEOUS | 0 refills | Status: AC
  Filled 2022-09-02: qty 2, 28d supply, fill #0
  Filled 2022-09-02: qty 2, 30d supply, fill #0
  Filled 2022-10-07: qty 2, 28d supply, fill #0

## 2022-09-03 ENCOUNTER — Other Ambulatory Visit: Payer: Self-pay

## 2022-10-07 ENCOUNTER — Other Ambulatory Visit: Payer: Self-pay

## 2023-02-06 IMAGING — MG MM DIGITAL SCREENING BILAT W/ TOMO AND CAD
8 series · 8 of 24 positions shown · non-contrast
Comparison: Previous exam(s).

ACR Breast Density Category a: The breast tissue is almost entirely
fatty.

CLINICAL DATA: Screening.

EXAM:
DIGITAL SCREENING BILATERAL MAMMOGRAM WITH TOMOSYNTHESIS AND CAD
TECHNIQUE: Bilateral screening digital craniocaudal and mediolateral oblique
mammograms were obtained. Bilateral screening digital breast
tomosynthesis was performed. The images were evaluated with
computer-aided detection.

[R MLO synth-2D]
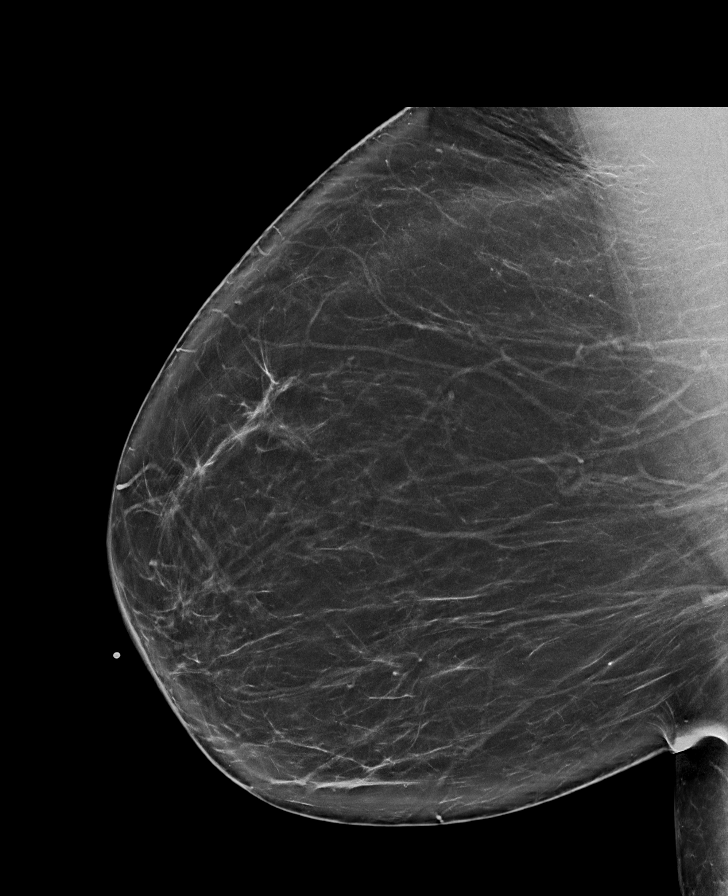

[R CC synth-2D]
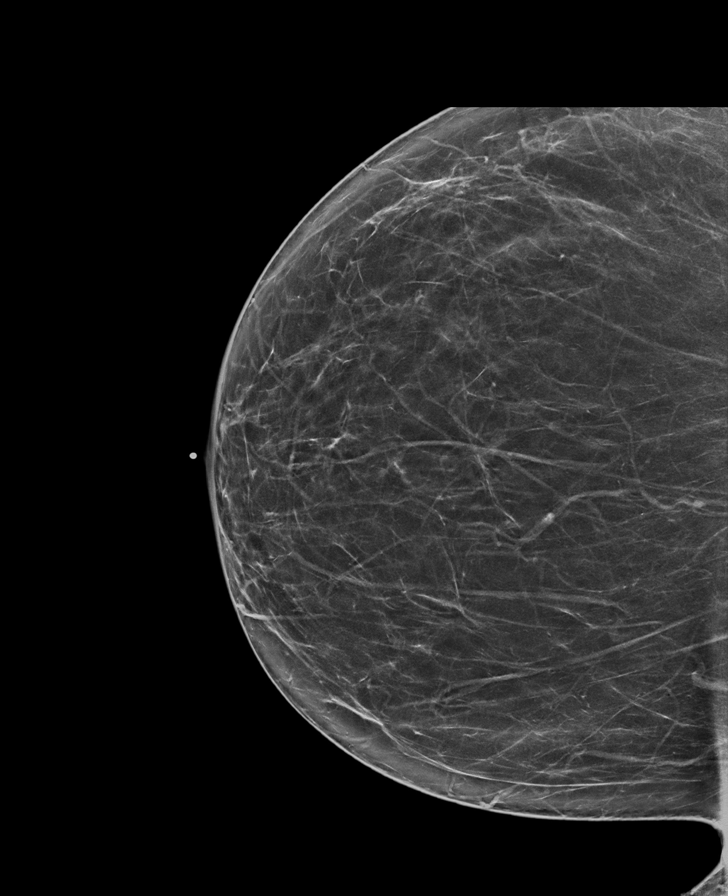

[L CC synth-2D]
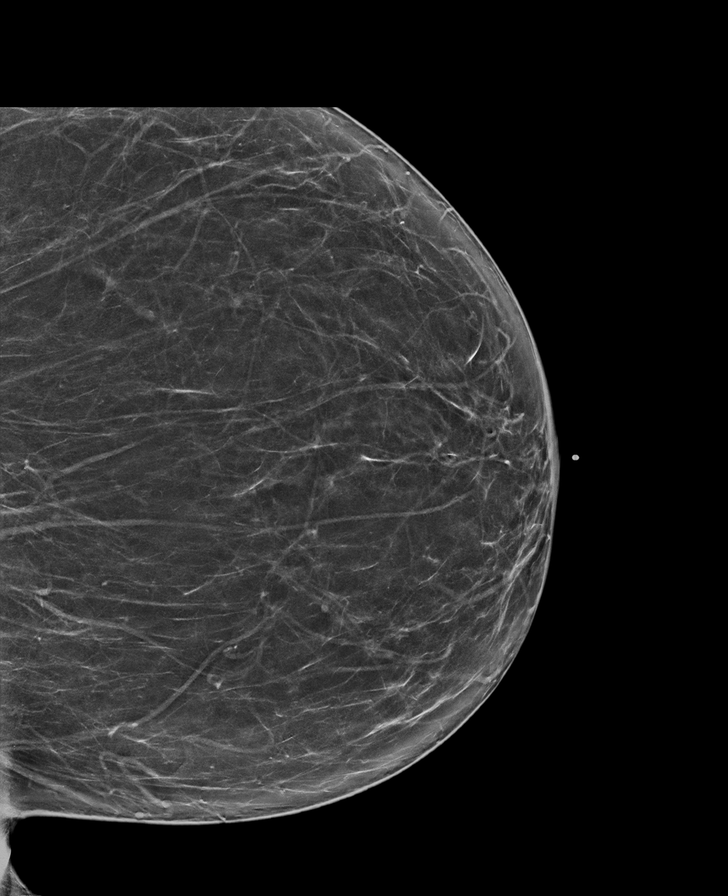

[L MLO synth-2D]
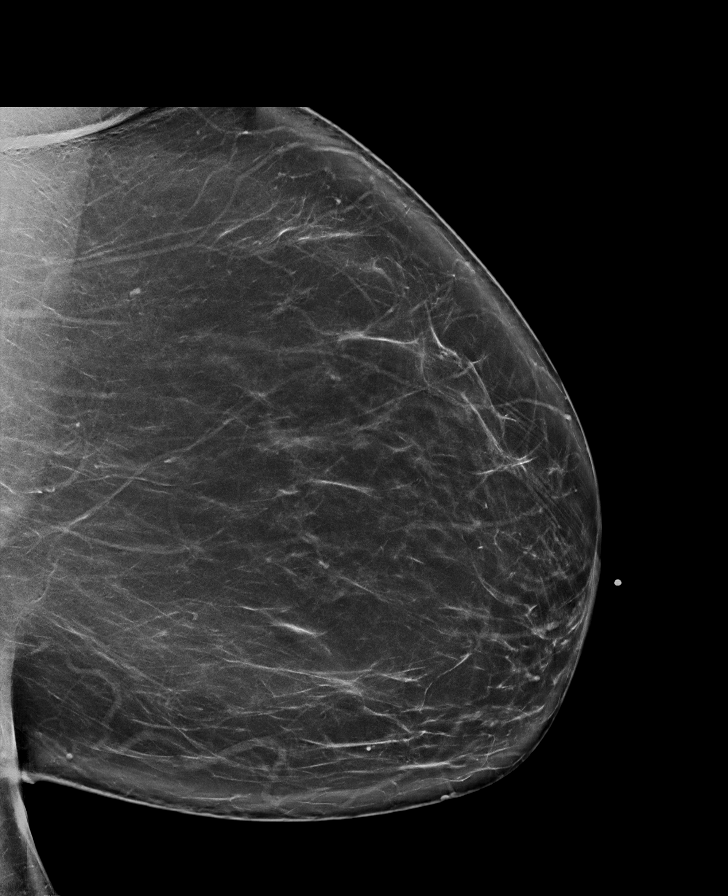

[R CC tomo · tomo slice 39/76.0]
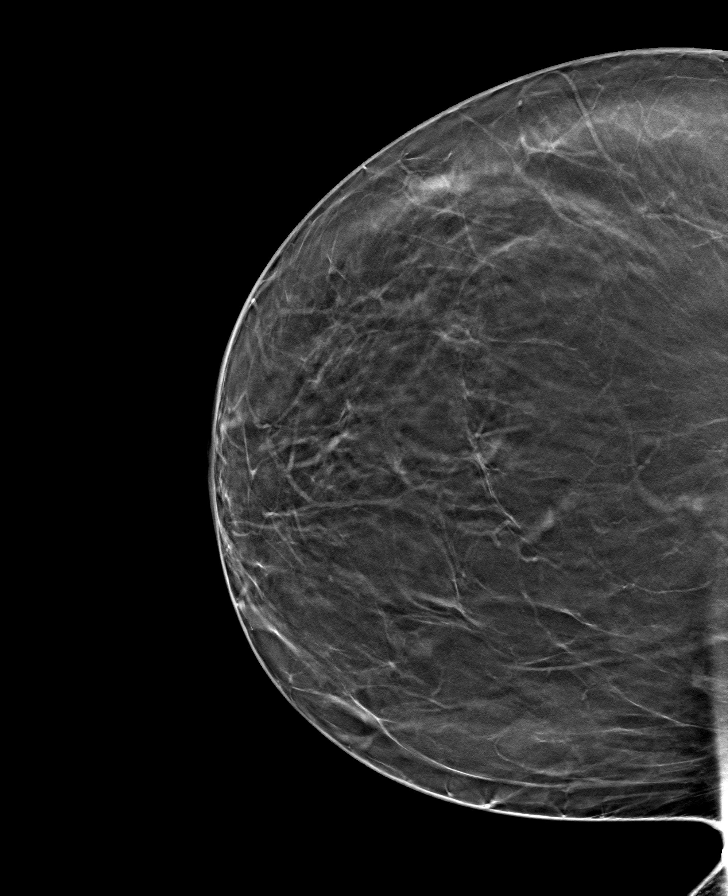

[L MLO tomo · tomo slice 48/95.0]
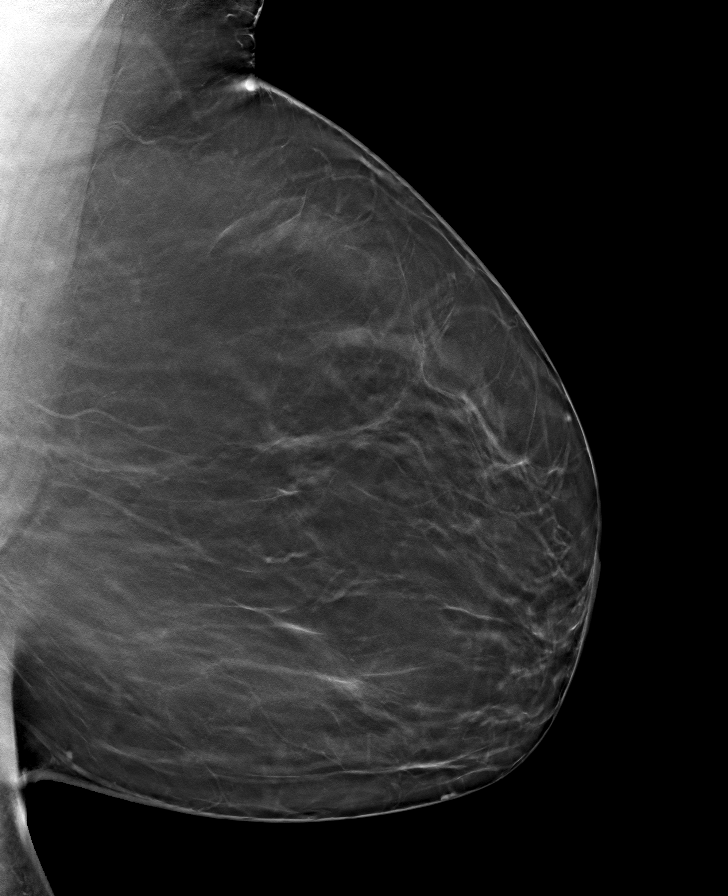

[R MLO tomo · tomo slice 49/96.0]
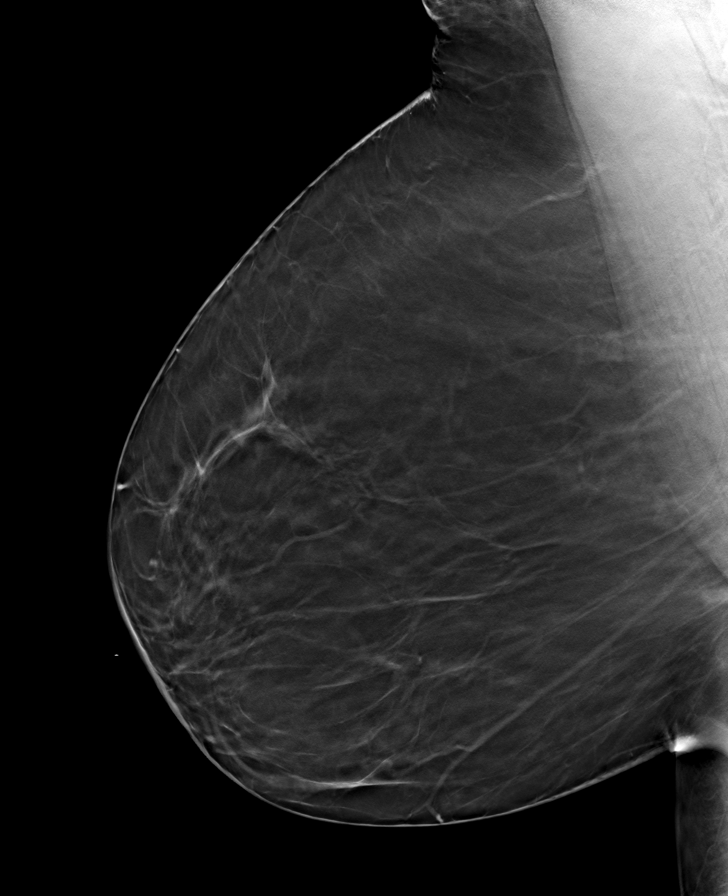

[L CC tomo · tomo slice 39/77.0]
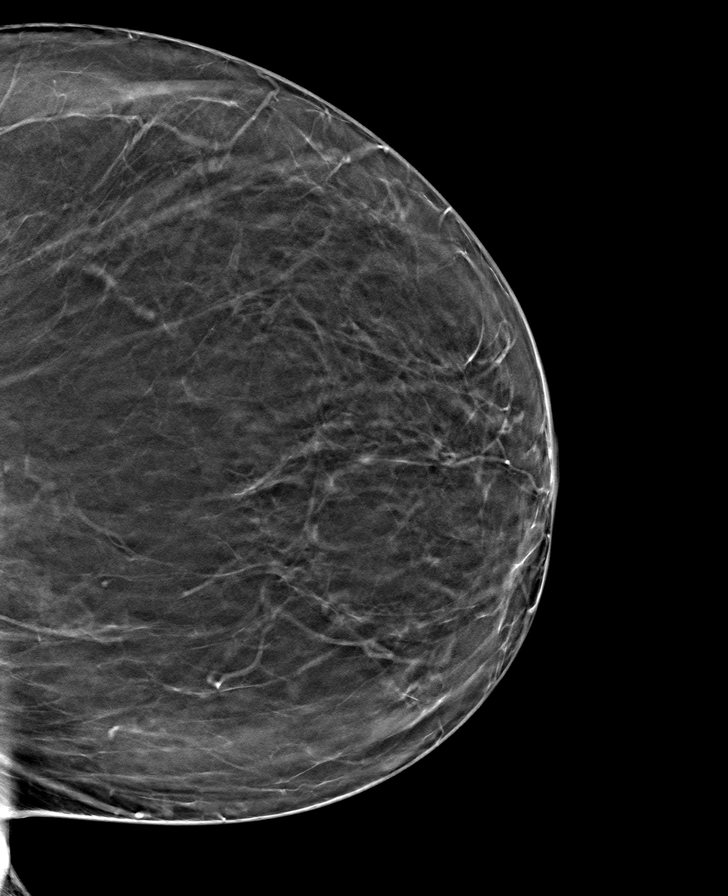

[8 of 24 positions shown; findings below may reference images not displayed]

FINDINGS: There are no findings suspicious for malignancy.
IMPRESSION: No mammographic evidence of malignancy. A result letter of this
screening mammogram will be mailed directly to the patient.

RECOMMENDATION:
Screening mammogram in one year. (Code:0E-3-N98)

BI-RADS CATEGORY  1: Negative.

## 2023-04-27 ENCOUNTER — Other Ambulatory Visit: Payer: Self-pay

## 2023-04-27 ENCOUNTER — Emergency Department: Payer: BC Managed Care – PPO

## 2023-04-27 DIAGNOSIS — H538 Other visual disturbances: Secondary | ICD-10-CM | POA: Insufficient documentation

## 2023-04-27 DIAGNOSIS — R197 Diarrhea, unspecified: Secondary | ICD-10-CM | POA: Insufficient documentation

## 2023-04-27 DIAGNOSIS — Z1152 Encounter for screening for COVID-19: Secondary | ICD-10-CM | POA: Diagnosis not present

## 2023-04-27 DIAGNOSIS — Z5321 Procedure and treatment not carried out due to patient leaving prior to being seen by health care provider: Secondary | ICD-10-CM | POA: Diagnosis not present

## 2023-04-27 DIAGNOSIS — R112 Nausea with vomiting, unspecified: Secondary | ICD-10-CM | POA: Insufficient documentation

## 2023-04-27 LAB — CBC WITH DIFFERENTIAL/PLATELET
Abs Immature Granulocytes: 0.02 10*3/uL (ref 0.00–0.07)
Basophils Absolute: 0 10*3/uL (ref 0.0–0.1)
Basophils Relative: 0 %
Eosinophils Absolute: 0 10*3/uL (ref 0.0–0.5)
Eosinophils Relative: 0 %
HCT: 41 % (ref 36.0–46.0)
Hemoglobin: 13.8 g/dL (ref 12.0–15.0)
Immature Granulocytes: 0 %
Lymphocytes Relative: 19 %
Lymphs Abs: 1.4 10*3/uL (ref 0.7–4.0)
MCH: 30.7 pg (ref 26.0–34.0)
MCHC: 33.7 g/dL (ref 30.0–36.0)
MCV: 91.3 fL (ref 80.0–100.0)
Monocytes Absolute: 0.4 10*3/uL (ref 0.1–1.0)
Monocytes Relative: 5 %
Neutro Abs: 5.5 10*3/uL (ref 1.7–7.7)
Neutrophils Relative %: 76 %
Platelets: 257 10*3/uL (ref 150–400)
RBC: 4.49 MIL/uL (ref 3.87–5.11)
RDW: 13.1 % (ref 11.5–15.5)
WBC: 7.4 10*3/uL (ref 4.0–10.5)
nRBC: 0 % (ref 0.0–0.2)

## 2023-04-27 LAB — RESP PANEL BY RT-PCR (RSV, FLU A&B, COVID)  RVPGX2
Influenza A by PCR: NEGATIVE
Influenza B by PCR: NEGATIVE
Resp Syncytial Virus by PCR: NEGATIVE
SARS Coronavirus 2 by RT PCR: NEGATIVE

## 2023-04-27 LAB — COMPREHENSIVE METABOLIC PANEL
ALT: 13 U/L (ref 0–44)
AST: 24 U/L (ref 15–41)
Albumin: 4.6 g/dL (ref 3.5–5.0)
Alkaline Phosphatase: 63 U/L (ref 38–126)
Anion gap: 10 (ref 5–15)
BUN: 19 mg/dL (ref 6–20)
CO2: 26 mmol/L (ref 22–32)
Calcium: 9.5 mg/dL (ref 8.9–10.3)
Chloride: 103 mmol/L (ref 98–111)
Creatinine, Ser: 0.87 mg/dL (ref 0.44–1.00)
GFR, Estimated: >60 mL/min (ref >60)
Glucose, Bld: 128 mg/dL — ABNORMAL HIGH (ref 70–99)
Potassium: 3.4 mmol/L — ABNORMAL LOW (ref 3.5–5.1)
Sodium: 139 mmol/L (ref 135–145)
Total Bilirubin: 0.6 mg/dL (ref 0.3–1.2)
Total Protein: 8.2 g/dL — ABNORMAL HIGH (ref 6.5–8.1)

## 2023-04-27 LAB — CBG MONITORING, ED: Glucose-Capillary: 73 mg/dL (ref 70–99)

## 2023-04-27 NOTE — ED Triage Notes (Signed)
Migraine x 2 days with associated blurred vision. Hx of same but does not have medications at home for management. Reports n/v/d as well. Denies fall or head injury. Pupils are equal and reactive. Pt reports earlier she had some numbness and weakness in her L arm that is now resolved. Pt states this is not typically associated with her migraines. Pt reports this episode occurred ~2030 and lasted about 30 minutes. Pt is alert and oriented. Extreme sensitivity to light noted.

## 2023-04-27 NOTE — ED Notes (Signed)
NO CODE STROKE PER DR. Arnoldo Morale

## 2023-04-28 ENCOUNTER — Emergency Department
Admission: EM | Admit: 2023-04-28 | Discharge: 2023-04-28 | Discharge status: Against Medical Advice | Payer: BC Managed Care – PPO | Attending: Emergency Medicine | Admitting: Emergency Medicine

## 2023-04-28 DIAGNOSIS — G43809 Other migraine, not intractable, without status migrainosus: Secondary | ICD-10-CM | POA: Diagnosis not present

## 2023-04-28 DIAGNOSIS — R197 Diarrhea, unspecified: Secondary | ICD-10-CM | POA: Diagnosis not present

## 2023-04-28 DIAGNOSIS — M329 Systemic lupus erythematosus, unspecified: Secondary | ICD-10-CM | POA: Diagnosis not present

## 2023-04-28 DIAGNOSIS — Z888 Allergy status to other drugs, medicaments and biological substances status: Secondary | ICD-10-CM | POA: Diagnosis not present

## 2023-04-28 DIAGNOSIS — I10 Essential (primary) hypertension: Secondary | ICD-10-CM | POA: Diagnosis not present

## 2023-04-28 NOTE — ED Notes (Signed)
No answer when called several times from lobby 

## 2023-05-21 IMAGING — CT CT ABD-PELV W/ CM
1 of 3 series · 13 of 32 positions shown, 18 images · IV contrast (APPLIED)
Comparison: None.

CLINICAL DATA: Mid abdominal pain with intermittent nausea for 1
week

EXAM:
CT ABDOMEN AND PELVIS WITH CONTRAST
TECHNIQUE: Multidetector CT imaging of the abdomen and pelvis was performed
using the standard protocol following bolus administration of
intravenous contrast.

[Series 2: abd/pelvis w/cm · axial · 0.83mm/px · z∈[-724,-294]mm · 13 of 98 slices shown, 18 images]
[im 6/98  soft-tissue]
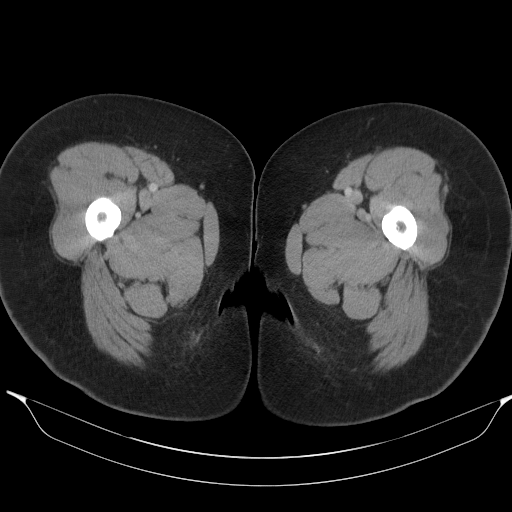
[im 6/98  bone]
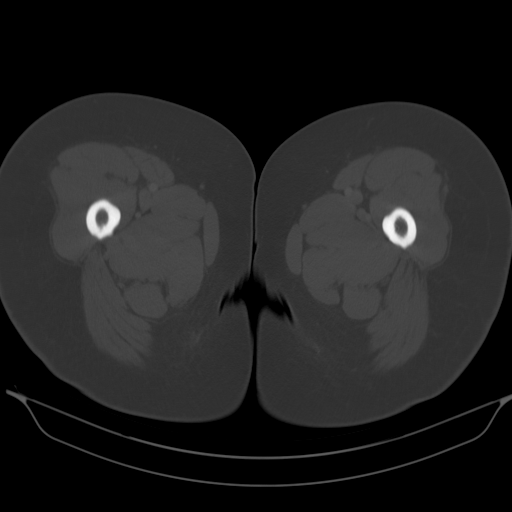
[im 18/98  soft-tissue]
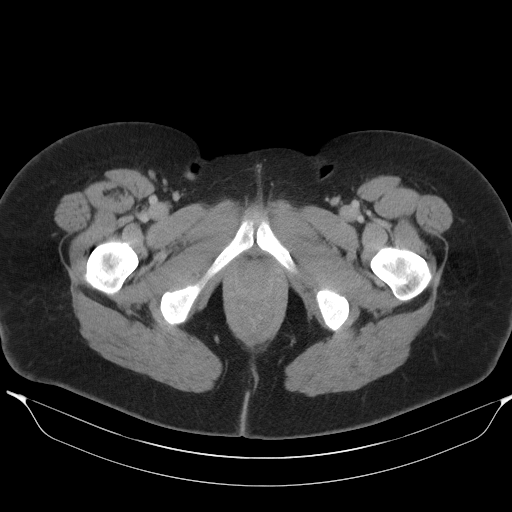
[im 23/98  soft-tissue]
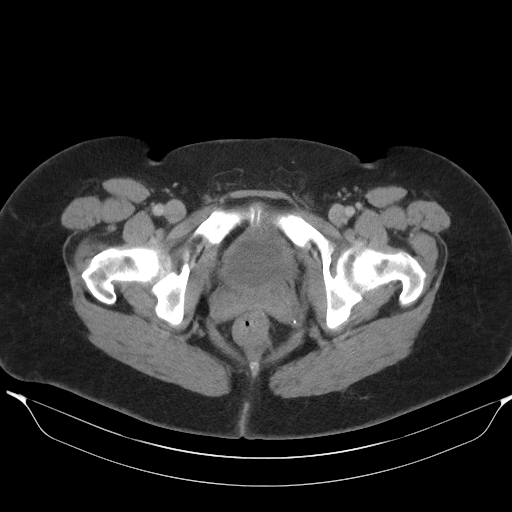
[im 29/98  soft-tissue]
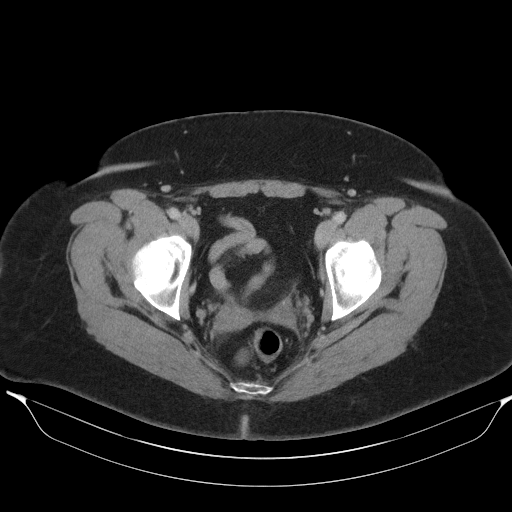
[im 40/98  soft-tissue]
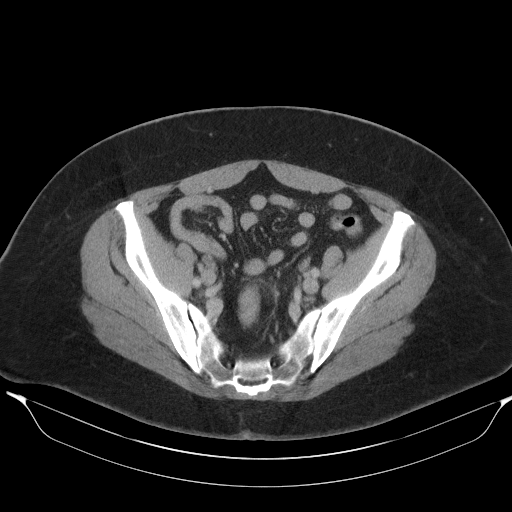
[im 46/98  soft-tissue]
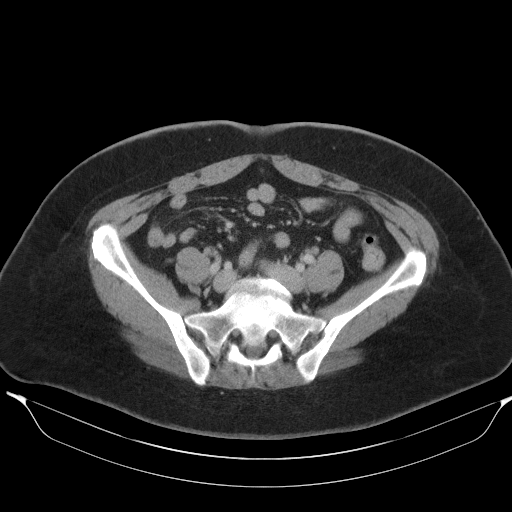
[im 52/98  soft-tissue]
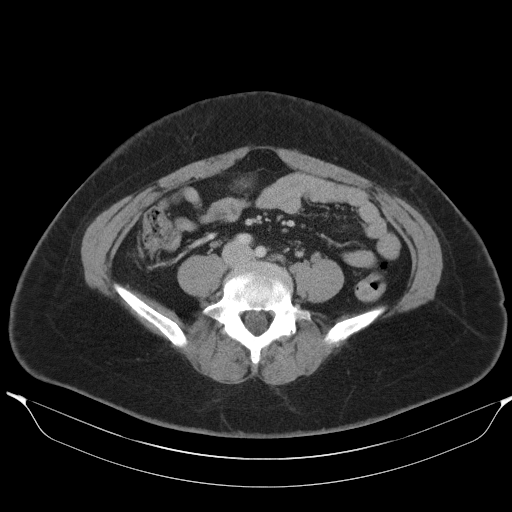
[im 63/98  soft-tissue]
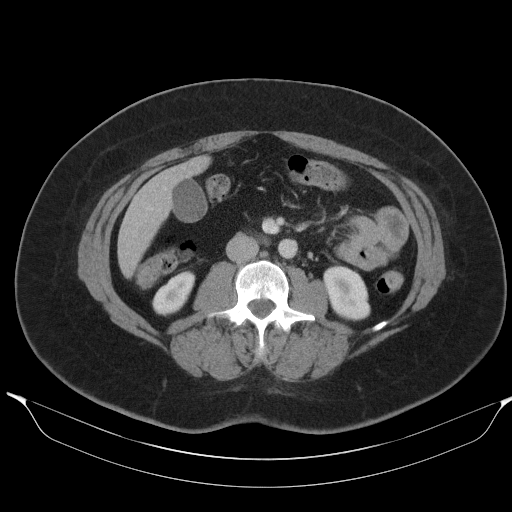
[im 69/98  soft-tissue]
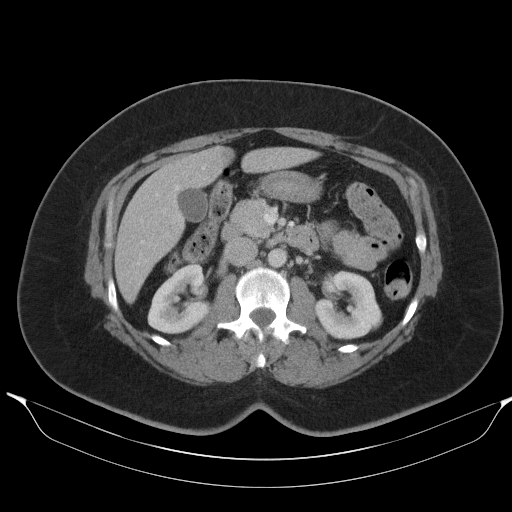
[im 69/98  bone]
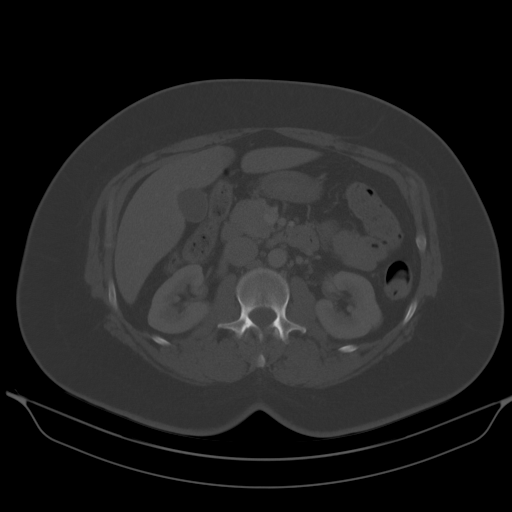
[im 75/98  soft-tissue]
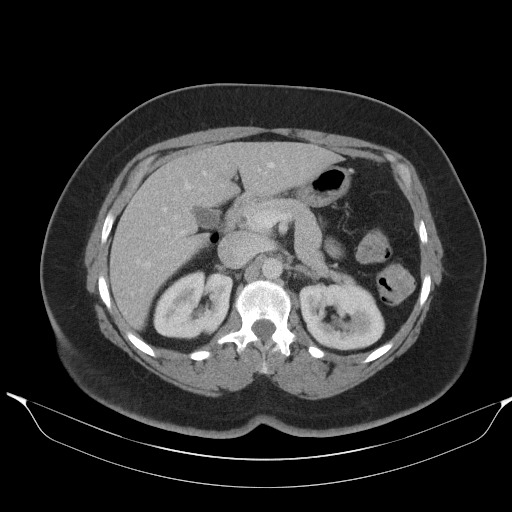
[im 75/98  lung]
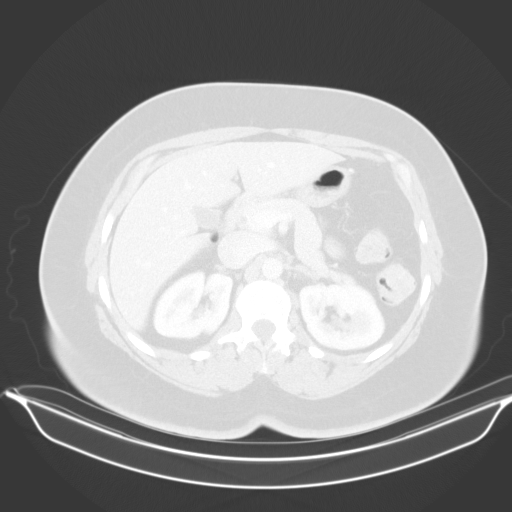
[im 80/98  lung]
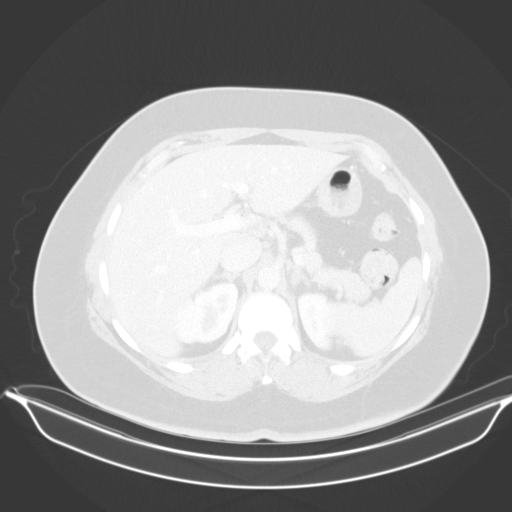
[im 86/98  soft-tissue]
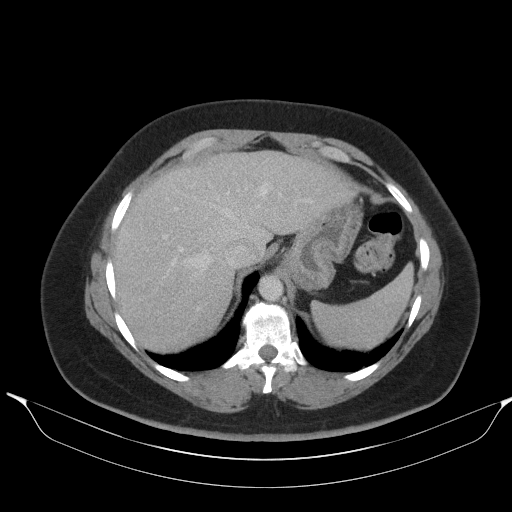
[im 86/98  lung]
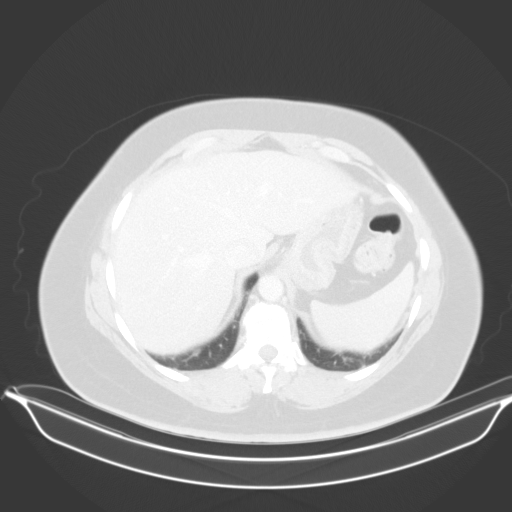
[im 92/98  soft-tissue]
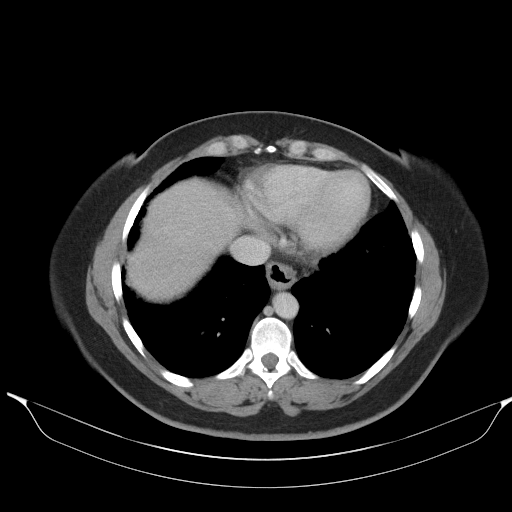
[im 92/98  lung]
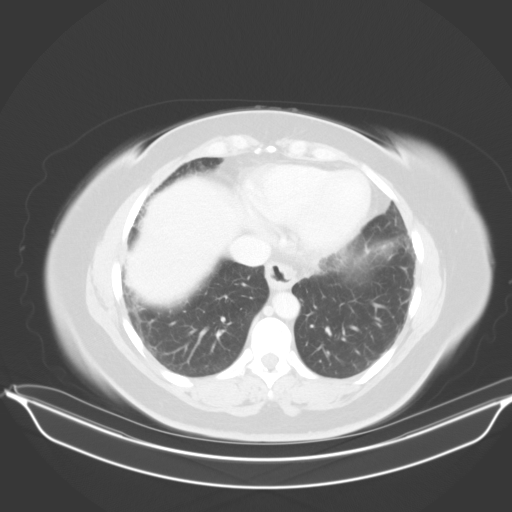

[13 of 32 positions shown; findings below may reference images not displayed]

RADIATION DOSE REDUCTION: This exam was performed according to the
departmental dose-optimization program which includes automated
exposure control, adjustment of the mA and/or kV according to
patient size and/or use of iterative reconstruction technique.

CONTRAST:  100mL [id number] IOPAMIDOL (NWWGSZ-I66) INJECTION 61%
FINDINGS: Lower chest: Mild bibasilar atelectasis.  Heart size is normal.

Hepatobiliary: No focal liver abnormality is seen. No gallstones,
gallbladder wall thickening, or biliary dilatation.

Pancreas: Unremarkable. No pancreatic ductal dilatation or
surrounding inflammatory changes.

Spleen: Normal in size without focal abnormality.

Adrenals/Urinary Tract: Unremarkable adrenal glands. Kidneys enhance
symmetrically without focal lesion, stone, or hydronephrosis.
Ureters are nondilated. Urinary bladder appears unremarkable for the
degree of distension.

Stomach/Bowel: Focally inflamed diverticulum of the distal sigmoid
colon with associated pericolonic fat stranding (series 2, image
62). No pericolonic fluid collection. Remainder of the colon is
within normal limits. Normal appendix within the right lower
quadrant. Small bowel within normal limits. Incidentally noted
cm lipoma along the greater curvature of stomach (series 2, image
20). Stomach otherwise within normal limits.

Vascular/Lymphatic: No significant vascular findings are present. No
enlarged abdominal or pelvic lymph nodes.

Reproductive: Status post hysterectomy. No adnexal masses.

Other: No free fluid. No abdominopelvic fluid collection. No
pneumoperitoneum. No abdominal wall hernia.

Musculoskeletal: Lower lumbar degenerative disc disease with grade 1
anterolisthesis of L4 on L5. No acute osseous abnormality.
IMPRESSION: Acute uncomplicated sigmoid diverticulitis.

## 2023-06-03 ENCOUNTER — Other Ambulatory Visit: Payer: Self-pay | Admitting: Internal Medicine

## 2023-06-03 DIAGNOSIS — Z1231 Encounter for screening mammogram for malignant neoplasm of breast: Secondary | ICD-10-CM

## 2023-07-07 ENCOUNTER — Ambulatory Visit
Admission: RE | Admit: 2023-07-07 | Discharge: 2023-07-07 | Disposition: A | Discharge status: Home / Self Care | Payer: BC Managed Care – PPO | Source: Ambulatory Visit | Attending: Internal Medicine | Admitting: Internal Medicine

## 2023-07-07 DIAGNOSIS — Z1231 Encounter for screening mammogram for malignant neoplasm of breast: Secondary | ICD-10-CM

## 2023-11-11 ENCOUNTER — Other Ambulatory Visit (HOSPITAL_COMMUNITY): Payer: Self-pay | Admitting: Internal Medicine

## 2023-11-11 ENCOUNTER — Ambulatory Visit (HOSPITAL_COMMUNITY)
Admission: RE | Admit: 2023-11-11 | Discharge: 2023-11-11 | Disposition: A | Discharge status: Home / Self Care | Payer: 59 | Source: Ambulatory Visit | Attending: Internal Medicine | Admitting: Internal Medicine

## 2023-11-11 DIAGNOSIS — M7989 Other specified soft tissue disorders: Secondary | ICD-10-CM | POA: Insufficient documentation

## 2024-04-24 ENCOUNTER — Other Ambulatory Visit (HOSPITAL_COMMUNITY): Payer: Self-pay | Admitting: Sports Medicine

## 2024-04-24 DIAGNOSIS — M25561 Pain in right knee: Secondary | ICD-10-CM

## 2024-05-03 ENCOUNTER — Ambulatory Visit

## 2024-05-10 ENCOUNTER — Ambulatory Visit

## 2024-07-17 ENCOUNTER — Other Ambulatory Visit: Payer: Self-pay | Admitting: Internal Medicine

## 2024-07-17 DIAGNOSIS — Z1231 Encounter for screening mammogram for malignant neoplasm of breast: Secondary | ICD-10-CM

## 2024-07-19 ENCOUNTER — Ambulatory Visit
Admission: RE | Admit: 2024-07-19 | Discharge: 2024-07-19 | Disposition: A | Discharge status: Home / Self Care | Source: Ambulatory Visit | Attending: Internal Medicine | Admitting: Internal Medicine

## 2024-07-19 DIAGNOSIS — Z1231 Encounter for screening mammogram for malignant neoplasm of breast: Secondary | ICD-10-CM

## 2024-10-10 ENCOUNTER — Other Ambulatory Visit (HOSPITAL_COMMUNITY): Payer: Self-pay | Admitting: Sports Medicine

## 2024-10-10 DIAGNOSIS — M25561 Pain in right knee: Secondary | ICD-10-CM

## 2024-10-18 ENCOUNTER — Ambulatory Visit (HOSPITAL_COMMUNITY)

## 2024-10-18 ENCOUNTER — Encounter (HOSPITAL_COMMUNITY): Payer: Self-pay
